# Patient Record
Sex: Male | Born: 1975 | Race: White | Hispanic: No | Marital: Married | Smoking: Current every day smoker
Health system: Southern US, Community
[De-identification: ages and names within clinical notes are randomized; demographics above are authoritative.]

## PROBLEM LIST (undated history)

## (undated) DIAGNOSIS — I10 Essential (primary) hypertension: Secondary | ICD-10-CM

## (undated) HISTORY — PX: CIRCUMCISION: SUR203

## (undated) HISTORY — DX: Essential (primary) hypertension: I10

---

## 1998-08-30 ENCOUNTER — Emergency Department (HOSPITAL_COMMUNITY): Admission: EM | Admit: 1998-08-30 | Discharge: 1998-08-30 | Payer: Self-pay | Admitting: Emergency Medicine

## 1998-08-30 ENCOUNTER — Encounter: Payer: Self-pay | Admitting: Emergency Medicine

## 1998-08-30 ENCOUNTER — Ambulatory Visit (HOSPITAL_BASED_OUTPATIENT_CLINIC_OR_DEPARTMENT_OTHER): Admission: RE | Admit: 1998-08-30 | Discharge: 1998-08-30 | Payer: Self-pay | Admitting: Orthopedic Surgery

## 2001-03-03 ENCOUNTER — Encounter: Payer: Self-pay | Admitting: Emergency Medicine

## 2001-03-03 ENCOUNTER — Emergency Department (HOSPITAL_COMMUNITY): Admission: EM | Admit: 2001-03-03 | Discharge: 2001-03-03 | Payer: Self-pay | Admitting: Emergency Medicine

## 2003-06-21 ENCOUNTER — Emergency Department (HOSPITAL_COMMUNITY): Admission: EM | Admit: 2003-06-21 | Discharge: 2003-06-21 | Payer: Self-pay | Admitting: Emergency Medicine

## 2005-03-17 ENCOUNTER — Emergency Department (HOSPITAL_COMMUNITY): Admission: EM | Admit: 2005-03-17 | Discharge: 2005-03-17 | Payer: Self-pay | Admitting: Emergency Medicine

## 2005-03-24 ENCOUNTER — Emergency Department (HOSPITAL_COMMUNITY): Admission: EM | Admit: 2005-03-24 | Discharge: 2005-03-24 | Payer: Self-pay | Admitting: Emergency Medicine

## 2010-04-05 ENCOUNTER — Emergency Department (HOSPITAL_COMMUNITY): Payer: Self-pay

## 2010-04-05 ENCOUNTER — Emergency Department (HOSPITAL_COMMUNITY)
Admission: EM | Admit: 2010-04-05 | Discharge: 2010-04-05 | Disposition: A | Discharge status: Home / Self Care | Payer: Self-pay | Attending: Emergency Medicine | Admitting: Emergency Medicine

## 2010-04-05 DIAGNOSIS — M25539 Pain in unspecified wrist: Secondary | ICD-10-CM | POA: Insufficient documentation

## 2010-04-05 DIAGNOSIS — M25439 Effusion, unspecified wrist: Secondary | ICD-10-CM | POA: Insufficient documentation

## 2019-12-01 ENCOUNTER — Emergency Department (HOSPITAL_COMMUNITY): Payer: Self-pay

## 2019-12-01 ENCOUNTER — Other Ambulatory Visit: Payer: Self-pay

## 2019-12-01 ENCOUNTER — Emergency Department (HOSPITAL_COMMUNITY)
Admission: EM | Admit: 2019-12-01 | Discharge: 2019-12-02 | Disposition: A | Discharge status: Home / Self Care | Payer: Self-pay | Attending: Emergency Medicine | Admitting: Emergency Medicine

## 2019-12-01 ENCOUNTER — Encounter (HOSPITAL_COMMUNITY): Payer: Self-pay | Admitting: Emergency Medicine

## 2019-12-01 DIAGNOSIS — I160 Hypertensive urgency: Secondary | ICD-10-CM | POA: Insufficient documentation

## 2019-12-01 DIAGNOSIS — F17210 Nicotine dependence, cigarettes, uncomplicated: Secondary | ICD-10-CM | POA: Insufficient documentation

## 2019-12-01 LAB — CBC WITH DIFFERENTIAL/PLATELET
Abs Immature Granulocytes: 0.04 10*3/uL (ref 0.00–0.07)
Basophils Absolute: 0.1 10*3/uL (ref 0.0–0.1)
Basophils Relative: 0 %
Eosinophils Absolute: 0.1 10*3/uL (ref 0.0–0.5)
Eosinophils Relative: 1 %
HCT: 46.5 % (ref 39.0–52.0)
Hemoglobin: 15.8 g/dL (ref 13.0–17.0)
Immature Granulocytes: 0 %
Lymphocytes Relative: 23 %
Lymphs Abs: 3 10*3/uL (ref 0.7–4.0)
MCH: 32.8 pg (ref 26.0–34.0)
MCHC: 34 g/dL (ref 30.0–36.0)
MCV: 96.7 fL (ref 80.0–100.0)
Monocytes Absolute: 0.9 10*3/uL (ref 0.1–1.0)
Monocytes Relative: 7 %
Neutro Abs: 9 10*3/uL — ABNORMAL HIGH (ref 1.7–7.7)
Neutrophils Relative %: 69 %
Platelets: 241 10*3/uL (ref 150–400)
RBC: 4.81 MIL/uL (ref 4.22–5.81)
RDW: 11.9 % (ref 11.5–15.5)
WBC: 13 10*3/uL — ABNORMAL HIGH (ref 4.0–10.5)
nRBC: 0 % (ref 0.0–0.2)

## 2019-12-01 LAB — COMPREHENSIVE METABOLIC PANEL
ALT: 12 U/L (ref 0–44)
AST: 15 U/L (ref 15–41)
Albumin: 4.4 g/dL (ref 3.5–5.0)
Alkaline Phosphatase: 77 U/L (ref 38–126)
Anion gap: 8 (ref 5–15)
BUN: 16 mg/dL (ref 6–20)
CO2: 25 mmol/L (ref 22–32)
Calcium: 9.3 mg/dL (ref 8.9–10.3)
Chloride: 102 mmol/L (ref 98–111)
Creatinine, Ser: 0.99 mg/dL (ref 0.61–1.24)
GFR, Estimated: >60 mL/min (ref >60)
Glucose, Bld: 103 mg/dL — ABNORMAL HIGH (ref 70–99)
Potassium: 3.8 mmol/L (ref 3.5–5.1)
Sodium: 135 mmol/L (ref 135–145)
Total Bilirubin: 0.5 mg/dL (ref 0.3–1.2)
Total Protein: 7.8 g/dL (ref 6.5–8.1)

## 2019-12-01 LAB — ETHANOL: Alcohol, Ethyl (B): <10 mg/dL (ref <10)

## 2019-12-01 LAB — TSH: TSH: 1.227 u[IU]/mL (ref 0.350–4.500)

## 2019-12-01 NOTE — ED Provider Notes (Signed)
[facility] EMERGENCY DEPARTMENT Provider Note   CSN: [id number] Arrival date & time: 12/01/19  1857     History Chief Complaint  Patient presents with  . Hypertension    Brandon Webb is a 44 y.o. male.  HPI    Previously well adult male with history of smoking cigarettes presents with concern for blurry vision, left foot numbness.  He notes no medication usage, smoking cigarettes daily.  He was well until about 1 month ago.  Now over the past month he has noticed episodes of blurry vision bilaterally, loss of sensation on the dorsum of his left foot, fatigue, particular with exertion, but no chest pain, no syncope, no fever, no overt vision loss. As symptoms progressed, he was resistant to evaluation, but today, at the behest of his family member he presents for evaluation. Currently he denies pain, discomfort, complains of ongoing blurry vision, fatigue, and left foot numbness as his primary complaints. History reviewed. No pertinent past medical history.  There are no problems to display for this patient.   History reviewed. No pertinent surgical history.     No family history on file.  Social History   Tobacco Use  . Smoking status: Current Every Day Smoker    Packs/day: 1.00    Years: 20.00    Pack years: 20.00    Types: Cigarettes  . Smokeless tobacco: Never Used  Vaping Use  . Vaping Use: Never used  Substance Use Topics  . Alcohol use: Not Currently  . Drug use: Not Currently    Home Medications Prior to Admission medications   Medication Sig Start Date End Date Taking? Authorizing Provider  ibuprofen (ADVIL) 200 MG tablet Take 200 mg by mouth every 6 (six) hours as needed.   Yes [provider]  tadalafil (CIALIS) 5 MG tablet  08/19/19  Yes [provider]    Allergies    Patient has no known allergies.  Review of Systems   Review of Systems  Constitutional:       Per HPI, otherwise negative  HENT:       Per HPI,  otherwise negative  Respiratory:       Per HPI, otherwise negative  Cardiovascular:       Per HPI, otherwise negative  Gastrointestinal: Negative for vomiting.  Endocrine:       Negative aside from HPI  Genitourinary:       Neg aside from HPI   Musculoskeletal:       Per HPI, otherwise negative  Skin: Negative.   Neurological: Positive for light-headedness and numbness. Negative for tremors, seizures, syncope, facial asymmetry, speech difficulty, weakness and headaches.    Physical Exam Updated Vital Signs BP (!) 136/107   Pulse (!) 58   Temp 98.4 F (36.9 C) (Oral)   Resp 16   Ht 5\' 7"  (1.702 m)   Wt 59 kg   SpO2 99%   BMI 20.36 kg/m   Physical Exam Vitals and nursing note reviewed.  Constitutional:      General: He is not in acute distress.    Appearance: He is well-developed.  HENT:     Head: Normocephalic and atraumatic.  Eyes:     Conjunctiva/sclera: Conjunctivae normal.  Cardiovascular:     Rate and Rhythm: Normal rate and regular rhythm.  Pulmonary:     Effort: Pulmonary effort is normal. No respiratory distress.     Breath sounds: No stridor.  Abdominal:     General: There is no distension.  Skin:    General: Skin is warm and dry.  Neurological:     Mental Status: He is alert and oriented to person, place, and time.     Motor: No weakness, tremor, atrophy, abnormal muscle tone or pronator drift.     Coordination: Coordination normal.     ED Results / Procedures / Treatments   Labs (all labs ordered are listed, but only abnormal results are displayed) Labs Reviewed  COMPREHENSIVE METABOLIC PANEL - Abnormal; Notable for the following components:      Result Value   Glucose, Bld 103 (*)    All other components within normal limits  CBC WITH DIFFERENTIAL/PLATELET - Abnormal; Notable for the following components:   WBC 13.0 (*)    Neutro Abs 9.0 (*)    All other components within normal limits  ETHANOL  TSH  URINALYSIS, ROUTINE W REFLEX  MICROSCOPIC  T4, FREE    EKG EKG Interpretation  Date/Time:  Monday December 01 2019 21:08:19 EST Ventricular Rate:  67 PR Interval:    QRS Duration: 95 QT Interval:  431 QTC Calculation: 455 R Axis:   93 Text Interpretation: Sinus rhythm Probable left atrial enlargement Borderline right axis deviation ST-t wave abnormality hypertrophic changes Abnormal ECG Confirmed by Gerhard Munch [phone]) on 12/01/2019 9:12:45 PM   Radiology CT Head Wo Contrast  Result Date: 12/01/2019 CLINICAL DATA:  Mental status change EXAM: CT HEAD WITHOUT CONTRAST TECHNIQUE: Contiguous axial images were obtained from the base of the skull through the vertex without intravenous contrast. COMPARISON:  None. FINDINGS: Brain: No evidence of acute territorial infarction, hemorrhage, hydrocephalus,extra-axial collection or mass lesion/mass effect. Normal gray-white differentiation. Ventricles are normal in size and contour. Vascular: No hyperdense vessel or unexpected calcification. Skull: The skull is intact. No fracture or focal lesion identified. Sinuses/Orbits: The visualized paranasal sinuses and mastoid air cells are clear. The orbits and globes intact. Other: None IMPRESSION: No acute intracranial abnormality. Electronically Signed   By: Jonna Clark M.D.   On: 12/01/2019 21:38    Procedures Procedures (including critical care time)  Medications Ordered in ED Medications - No data to display  ED Course  I have reviewed the triage vital signs and the nursing notes.  Pertinent labs & imaging results that were available during my care of the patient were reviewed by me and considered in my medical decision making (see chart for details).    MDM Rules/Calculators/A&P                          11:42 PM On repeat exam the patient is now accompanied by his wife.  We discussed all findings, including generally reassuring CT scan, EKG, labs.  No evidence for intracranial pathology, thyroid studies reassuring  as well. Urinalysis somewhat reassuring.  This adult male presents with 1 month of change from baseline with new blurry vision, numbness in his left foot, no objective neuro deficiencies. Given the patient's hypertension here, some suspicion for this contributing to his episodes, though less obvious intracranial phenomena also considered.  Patient, his wife and I also discussed the importance of smoking cessation.  Patient started on antihypertensives will follow up with primary care and neurology as an outpatient for appropriate ongoing studies. Final Clinical Impression(s) / ED Diagnoses Final diagnoses:  Hypertensive urgency    Rx / DC Orders ED Discharge Orders         Ordered    hydrochlorothiazide (HYDRODIURIL) 25 MG tablet  Daily  12/02/19 0015           Gerhard Munch, MD 12/02/19 [phone]

## 2019-12-01 NOTE — ED Triage Notes (Signed)
Pt c/o high blood pressure with blurry vision intermittently x 1 month, pt also reports a "knot" to his left calf x a few months, reports he has numbness to his left foot x 1 week

## 2019-12-02 LAB — URINALYSIS, ROUTINE W REFLEX MICROSCOPIC
Bilirubin Urine: NEGATIVE
Glucose, UA: NEGATIVE mg/dL
Hgb urine dipstick: NEGATIVE
Ketones, ur: NEGATIVE mg/dL
Leukocytes,Ua: NEGATIVE
Nitrite: NEGATIVE
Protein, ur: NEGATIVE mg/dL
Specific Gravity, Urine: 1.025 (ref 1.005–1.030)
pH: 5 (ref 5.0–8.0)

## 2019-12-02 LAB — T4, FREE: Free T4: 0.98 ng/dL (ref 0.61–1.12)

## 2019-12-02 MED ORDER — HYDROCHLOROTHIAZIDE 25 MG PO TABS: 25.0000 mg | ORAL_TABLET | Freq: Every day | ORAL | 2 refills | Status: DC

## 2019-12-02 NOTE — Discharge Instructions (Addendum)
As discussed, your evaluation today has been largely reassuring.  But, it is important that you monitor your condition carefully, and do not hesitate to return to the ED if you develop new, or concerning changes in your condition.  Please follow-up with our neurology physician for appropriate ongoing care.

## 2021-01-11 ENCOUNTER — Ambulatory Visit (INDEPENDENT_AMBULATORY_CARE_PROVIDER_SITE_OTHER): Payer: Self-pay | Admitting: Urology

## 2021-01-11 ENCOUNTER — Other Ambulatory Visit: Payer: Self-pay | Admitting: Urology

## 2021-01-11 ENCOUNTER — Other Ambulatory Visit: Payer: Self-pay

## 2021-01-11 ENCOUNTER — Encounter: Payer: Self-pay | Admitting: Urology

## 2021-01-11 VITALS — BP 144/90 | HR 74 | Ht 67.0 in | Wt 135.0 lb

## 2021-01-11 DIAGNOSIS — N4889 Other specified disorders of penis: Secondary | ICD-10-CM

## 2021-01-11 DIAGNOSIS — N475 Adhesions of prepuce and glans penis: Secondary | ICD-10-CM

## 2021-01-11 NOTE — Progress Notes (Signed)
Surgical Physician Order Form [facility] [place]  * Scheduling expectation : Next Available  *Length of Case: 60 minutes  *MD Preforming Case: Di Kindle, MD  *Assistant Needed: no  *[facility]ility Preference: [facility]  *Clearance needed: no  *Anticoagulation Instructions: N/A  *Aspirin Instructions: N/A  -Admit type: OUTpatient  -Anesthesia: Choice  -Use Standing Orders:  N/A  *Diagnosis:  Penile adhesions with skin bridge  *Procedure: Lysis of penile adhesions  Additional orders:  none  -Equipment:  N/A -VTE Prophylaxis Standing Order SCDs       Other:   -Standing Lab Orders Per Anesthesia    Lab other: None  -Standing Test orders EKG/Chest x-ray per Anesthesia       Test other:   - Medications:   None  -Other orders:  N/A  *Post-op visit Date/Instructions:  1 month follow up

## 2021-01-11 NOTE — Progress Notes (Signed)
Urological Symptom Review  Patient is experiencing the following symptoms:  NO SYMPTOMS  Review of Systems  Gastrointestinal (upper)  : Negative for upper GI symptoms  Gastrointestinal (lower) : Negative for lower GI symptoms  Constitutional : Negative for symptoms  Skin: Negative for skin symptoms  Eyes: Negative for eye symptoms  Ear/Nose/Throat : Negative for Ear/Nose/Throat symptoms  Hematologic/Lymphatic: Swollen glands  Cardiovascular : Negative for cardiovascular symptoms  Respiratory : Negative for respiratory symptoms  Endocrine: Negative for endocrine symptoms  Musculoskeletal: Negative for musculoskeletal symptoms  Neurological: Negative for neurological symptoms  Psychologic: Negative for psychiatric symptoms

## 2021-01-11 NOTE — Progress Notes (Signed)
Assessment: 1. Penile adhesions w/skin bridging      Plan: Options for management discussed with the patient including continued observation versus surgical management with lysis of penile adhesions.  Given the length of the adhesion, I think this would best be done under anesthesia in the operating room.  Risk and benefits of the procedure discussed.  He would like to get information on the cost of the procedure before scheduling. Schedule for lysis of penile adhesions  Procedure: The patient will be scheduled for lysis of penile adhesions with skin bridge at [facility].  Surgical request is placed with the surgery schedulers and will be scheduled at the patient's/family request. Informed consent is given as documented below. Anesthesia:  choice  The patient does not have sleep apnea, history of MRSA, history of VRE, history of cardiac device requiring special anesthetic needs. Patient is stable and considered clear for surgical in an outpatient ambulatory surgery setting as well as patient hospital setting.  Consent for Operation or Procedure: Provider Certification I hereby certify that the nature, purpose, benefits, usual and most frequent risks of, and alternatives to, the operation or procedure have been explained to the patient (or person authorized to sign for the patient) either by me as responsible physician or by the provider who is to perform the operation or procedure. Time spent such that the patient/family has had an opportunity to ask questions, and that those questions have been answered. The patient or the patient's representative has been advised that selected tasks may be performed by assistants to the primary health care provider(s). I believe that the patient (or person authorized to sign for the patient) understands what has been explained, and has consented to the operation or procedure. No guarantees were implied or made.   Chief Complaint:  Chief Complaint   Patient presents with   penile skin bridge    History of Present Illness:  Brandon Webb is a 45 y.o. year old male who is seen for evaluation of penile skin bridge.  He underwent a circumcision as an infant.  Since that time, he has noted a bridge of skin on the dorsal aspect of the penis.  He does note some occasional discharge from this area.  He also has some discomfort with erections.  No history of balanitis.  He is not having any difficulty voiding.  Past Medical History:  Past Medical History:  Diagnosis Date   Hypertension     Past Surgical History:  Past Surgical History:  Procedure Laterality Date   CIRCUMCISION      Allergies:  No Known Allergies  Family History:  History reviewed. No pertinent family history.  Social History:  Social History   Tobacco Use   Smoking status: Every Day    Packs/day: 1.00    Years: 20.00    Pack years: 20.00    Types: Cigarettes   Smokeless tobacco: Never  Vaping Use   Vaping Use: Never used  Substance Use Topics   Alcohol use: Not Currently   Drug use: Not Currently    Review of symptoms:  Constitutional:  Negative for unexplained weight loss, night sweats, fever, chills ENT:  Negative for nose bleeds, sinus pain, painful swallowing CV:  Negative for chest pain, shortness of breath, exercise intolerance, palpitations, loss of consciousness Resp:  Negative for cough, wheezing, shortness of breath GI:  Negative for nausea, vomiting, diarrhea, bloody stools GU:  Positives noted in HPI; otherwise negative for gross hematuria, dysuria, urinary incontinence Neuro:  Negative for seizures, poor balance, limb weakness, slurred speech Psych:  Negative for lack of energy, depression, anxiety Endocrine:  Negative for polydipsia, polyuria, symptoms of hypoglycemia (dizziness, hunger, sweating) Hematologic:  Negative for anemia, purpura, petechia, prolonged or excessive bleeding, use of anticoagulants  Allergic:  Negative  for difficulty breathing or choking as a result of exposure to anything; no shellfish allergy; no allergic response (rash/itch) to materials, foods  Physical exam: BP (!) 144/90    Pulse 74    Ht 5\' 7"  (1.702 m)    Wt 135 lb (61.2 kg)    BMI 21.14 kg/m  GENERAL APPEARANCE:  Well appearing, well developed, well nourished, NAD HEENT: Atraumatic, Normocephalic, oropharynx clear. NECK: Supple without lymphadenopathy or thyromegaly. LUNGS: Clear to auscultation bilaterally. HEART: Regular Rate and Rhythm without murmurs, gallops, or rubs. ABDOMEN: Soft, non-tender, No Masses. EXTREMITIES: Moves all extremities well.  Without clubbing, cyanosis, or edema. NEUROLOGIC:  Alert and oriented x 3, normal gait, CN II-XII grossly intact.  MENTAL STATUS:  Appropriate. BACK:  Non-tender to palpation.  No CVAT SKIN:  Warm, dry and intact.   GU: Penis: Approximately 2 cm skin bridge on dorsal aspect of penis between shaft and glans; no evidence of infection Meatus: Normal Scrotum: normal, no masses   Results: None

## 2021-01-12 ENCOUNTER — Telehealth: Payer: Self-pay | Admitting: Urology

## 2021-01-12 NOTE — Telephone Encounter (Signed)
Called pt. To discuss surgery date of 01/28/21. No answer and Voice mailbox full so I could not leave a message. Sent pt. A mychart message to return my call

## 2021-01-12 NOTE — Progress Notes (Signed)
[facility] Surgery Posting Form   Surgery Date/Time: Date: 01/28/2021  Surgeon: Dr. 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

## 2021-01-24 NOTE — Patient Instructions (Signed)
Brandon Webb  01/24/2021     @PREFPERIOPPHARMACY @   Your procedure is scheduled on  01/28/2021.   Report to 01/30/2021 at  [phone]  A.M.   Call this number if you have problems the morning of surgery:  [phone]   Remember:  Do not eat or drink after midnight.      Take these medicines the morning of surgery with A SIP OF WATER                                         None     Do not wear jewelry, make-up or nail polish.  Do not wear lotions, powders, or perfumes, or deodorant.  Do not shave 48 hours prior to surgery.  Men may shave face and neck.  Do not bring valuables to the hospital.  [facility] is not responsible for any belongings or valuables.  Contacts, dentures or bridgework may not be worn into surgery.  Leave your suitcase in the car.  After surgery it may be brought to your room.  For patients admitted to the hospital, discharge time will be determined by your treatment team.  Patients discharged the day of surgery will not be allowed to drive home and must have someone with them for 24 hours.    Special instructions:   DO NOT smoke tobacco or vape for 24 hours before your procedure.  Please read over the following fact sheets that you were given. Coughing and Deep Breathing, Surgical Site Infection Prevention, Anesthesia Post-op Instructions, and Care and Recovery After Surgery      Incision Care, Adult An incision is a cut that a doctor makes in your skin for surgery. Most times, these cuts are closed after surgery. Your cut from surgery may be closed with: Stitches (sutures). Staples. Skin glue. Skin tape (adhesive) strips. You may need to go back to your doctor to have stitches or staples taken out. This may happen many days or many weeks after your surgery. You need to take good care of your cut so it does not get infected. Follow instructions from your doctor about how to care for your cut. Supplies needed: Soap and  water. A clean hand towel. Wound cleanser. A clean bandage (dressing), if needed. Cream or ointment, if told by your doctor. Clean gauze. How to care for your cut from surgery Cleaning your cut Ask your doctor how to clean your cut. You may need to: Wear medical gloves. Use mild soap and water, or a wound cleanser. Use a clean gauze to pat your cut dry after you clean it. Changing your bandage Wash your hands with soap and water for at least 20 seconds before and after you change your bandage. If you cannot use soap and water, use hand sanitizer. Do not usedisinfectants or antiseptics, such as rubbing alcohol, to clean your wound unless told by your doctor. Change your bandage as told by your doctor. Leavestitches or skin glue in place for at least 2 weeks. Leave tape strips alone unless you are told to take them off. You may trim the edges of the tape strips if they curl up. Put a cream or ointment on your cut. Do this only as told. Cover your cut with a clean bandage. Ask your doctor when you can leave your cut uncovered. Checking for infection  Check your cut area every day for signs of infection. Check for: More redness, swelling, or pain. More fluid or blood. New warmth. Hardness or a new rash around the incision. Pus or a bad smell.  Follow these instructions at home Medicines Take over-the-counter and prescription medicines only as told by your doctor. If you were prescribed an antibiotic medicine, cream, or ointment, use it as told by your doctor. Do not stop using the antibiotic even if you start to feel better. Eating and drinking Eat foods that have a lot of certain nutrients, such as protein, vitamin A, and vitamin C. These foods help your cut heal. Foods rich in protein include meat, fish, eggs, dairy, beans, nuts, and protein drinks. Foods rich in vitamin A include carrots and dark green, leafy vegetables. Foods rich in vitamin C include citrus fruits, tomatoes,  broccoli, and peppers. Drink enough fluid to keep your pee (urine) pale yellow. General instructions  Do not take baths, swim, or use a hot tub. Ask your doctor about taking showers or sponge baths. Limit movement around your cut. This helps with healing. Try not to strain, lift, or exercise for the first 2 weeks, or for as long as told by your doctor. Return to your normal activities as told by your doctor. Ask your doctor what activities are safe for you. Do not scratch, scrub, or pick at your cut. Keep it covered as told by your doctor. Protect your cut from the sun when you are outside for the first 6 months, or for as long as told by your doctor. Cover up the scar area or put on sunscreen that has an SPF of at least 30. Do not use any products that contain nicotine or tobacco, such as cigarettes, e-cigarettes, and chewing tobacco. These can delay cut healing. If you need help quitting, ask your doctor. Keep all follow-up visits. Contact a doctor if: You have any of these signs of infection around your cut: More redness, swelling, or pain. More fluid or blood. New warmth or hardness. Pus or a bad smell. A new rash. You have a fever. You feel like you may vomit (nauseous). You vomit. You are dizzy. Your stitches, staples, skin glue, or tape strips come undone. Your cut gets bigger. You have a fever. Get help right away if: Your cut bleeds through your bandage, and bleeding does not stop with gentle pressure. Your cut opens up and comes apart. These symptoms may be an emergency. Do not wait to see if the symptoms will go away. Get medical help right away. Call your local emergency services (911 in the [place] Do not drive yourself to the hospital. Summary Follow instructions from your doctor about how to care for your cut. Wash your hands with soap and water for at least 20 seconds before and after you change your bandage. If you cannot use soap and water, use hand sanitizer. Check  your cut area every day for signs of infection. Keep all follow-up visits. This information is not intended to replace advice given to you by your health care provider. Make sure you discuss any questions you have with your health care provider. Document Revised: 04/05/2020 Document Reviewed: 04/05/2020 Elsevier Patient Education  2022 [employer] General Anesthesia, Adult, Care After This sheet gives you information about how to care for yourself after your procedure. Your health care provider may also give you more specific instructions. If you have problems or questions, contact your health care provider. What can I expect  after the procedure? After the procedure, the following side effects are common: Pain or discomfort at the IV site. Nausea. Vomiting. Sore throat. Trouble concentrating. Feeling cold or chills. Feeling weak or tired. Sleepiness and fatigue. Soreness and body aches. These side effects can affect parts of the body that were not involved in surgery. Follow these instructions at home: For the time period you were told by your health care provider:  Rest. Do not participate in activities where you could fall or become injured. Do not drive or use machinery. Do not drink alcohol. Do not take sleeping pills or medicines that cause drowsiness. Do not make important decisions or sign legal documents. Do not take care of children on your own. Eating and drinking Follow any instructions from your health care provider about eating or drinking restrictions. When you feel hungry, start by eating small amounts of foods that are soft and easy to digest (bland), such as toast. Gradually return to your regular diet. Drink enough fluid to keep your urine pale yellow. If you vomit, rehydrate by drinking water, juice, or clear broth. General instructions If you have sleep apnea, surgery and certain medicines can increase your risk for breathing problems. Follow instructions from  your health care provider about wearing your sleep device: Anytime you are sleeping, including during daytime naps. While taking prescription pain medicines, sleeping medicines, or medicines that make you drowsy. Have a responsible adult stay with you for the time you are told. It is important to have someone help care for you until you are awake and alert. Return to your normal activities as told by your health care provider. Ask your health care provider what activities are safe for you. Take over-the-counter and prescription medicines only as told by your health care provider. If you smoke, do not smoke without supervision. Keep all follow-up visits as told by your health care provider. This is important. Contact a health care provider if: You have nausea or vomiting that does not get better with medicine. You cannot eat or drink without vomiting. You have pain that does not get better with medicine. You are unable to pass urine. You develop a skin rash. You have a fever. You have redness around your IV site that gets worse. Get help right away if: You have difficulty breathing. You have chest pain. You have blood in your urine or stool, or you vomit blood. Summary After the procedure, it is common to have a sore throat or nausea. It is also common to feel tired. Have a responsible adult stay with you for the time you are told. It is important to have someone help care for you until you are awake and alert. When you feel hungry, start by eating small amounts of foods that are soft and easy to digest (bland), such as toast. Gradually return to your regular diet. Drink enough fluid to keep your urine pale yellow. Return to your normal activities as told by your health care provider. Ask your health care provider what activities are safe for you. This information is not intended to replace advice given to you by your health care provider. Make sure you discuss any questions you have with  your health care provider. Document Revised: 09/18/2019 Document Reviewed: 04/17/2019 Elsevier Patient Education  2022 [employer] Inc[employer] How to Use Chlorhexidine for Bathing Chlorhexidine gluconate (CHG) is a germ-killing (antiseptic) solution that is used to clean the skin. It can get rid of the bacteria that normally live on the skin and  can keep them away for about 24 hours. To clean your skin with CHG, you may be given: A CHG solution to use in the shower or as part of a sponge bath. A prepackaged cloth that contains CHG. Cleaning your skin with CHG may help lower the risk for infection: While you are staying in the intensive care unit of the hospital. If you have a vascular access, such as a central line, to provide short-term or long-term access to your veins. If you have a catheter to drain urine from your bladder. If you are on a ventilator. A ventilator is a machine that helps you breathe by moving air in and out of your lungs. After surgery. What are the risks? Risks of using CHG include: A skin reaction. Hearing loss, if CHG gets in your ears and you have a perforated eardrum. Eye injury, if CHG gets in your eyes and is not rinsed out. The CHG product catching fire. Make sure that you avoid smoking and flames after applying CHG to your skin. Do not use CHG: If you have a chlorhexidine allergy or have previously reacted to chlorhexidine. On babies younger than 71 months of age. How to use CHG solution Use CHG only as told by your health care provider, and follow the instructions on the label. Use the full amount of CHG as directed. Usually, this is one bottle. During a shower Follow these steps when using CHG solution during a shower (unless your health care provider gives you different instructions): Start the shower. Use your normal soap and shampoo to wash your face and hair. Turn off the shower or move out of the shower stream. Pour the CHG onto a clean washcloth. Do not use  any type of brush or rough-edged sponge. Starting at your neck, lather your body down to your toes. Make sure you follow these instructions: If you will be having surgery, pay special attention to the part of your body where you will be having surgery. Scrub this area for at least 1 minute. Do not use CHG on your head or face. If the solution gets into your ears or eyes, rinse them well with water. Avoid your genital area. Avoid any areas of skin that have broken skin, cuts, or scrapes. Scrub your back and under your arms. Make sure to wash skin folds. Let the lather sit on your skin for 1-2 minutes or as long as told by your health care provider. Thoroughly rinse your entire body in the shower. Make sure that all body creases and crevices are rinsed well. Dry off with a clean towel. Do not put any substances on your body afterward--such as powder, lotion, or perfume--unless you are told to do so by your health care provider. Only use lotions that are recommended by the manufacturer. Put on clean clothes or pajamas. If it is the night before your surgery, sleep in clean sheets.  During a sponge bath Follow these steps when using CHG solution during a sponge bath (unless your health care provider gives you different instructions): Use your normal soap and shampoo to wash your face and hair. Pour the CHG onto a clean washcloth. Starting at your neck, lather your body down to your toes. Make sure you follow these instructions: If you will be having surgery, pay special attention to the part of your body where you will be having surgery. Scrub this area for at least 1 minute. Do not use CHG on your head or face. If the solution  gets into your ears or eyes, rinse them well with water. Avoid your genital area. Avoid any areas of skin that have broken skin, cuts, or scrapes. Scrub your back and under your arms. Make sure to wash skin folds. Let the lather sit on your skin for 1-2 minutes or as long  as told by your health care provider. Using a different clean, wet washcloth, thoroughly rinse your entire body. Make sure that all body creases and crevices are rinsed well. Dry off with a clean towel. Do not put any substances on your body afterward--such as powder, lotion, or perfume--unless you are told to do so by your health care provider. Only use lotions that are recommended by the manufacturer. Put on clean clothes or pajamas. If it is the night before your surgery, sleep in clean sheets. How to use CHG prepackaged cloths Only use CHG cloths as told by your health care provider, and follow the instructions on the label. Use the CHG cloth on clean, dry skin. Do not use the CHG cloth on your head or face unless your health care provider tells you to. When washing with the CHG cloth: Avoid your genital area. Avoid any areas of skin that have broken skin, cuts, or scrapes. Before surgery Follow these steps when using a CHG cloth to clean before surgery (unless your health care provider gives you different instructions): Using the CHG cloth, vigorously scrub the part of your body where you will be having surgery. Scrub using a back-and-forth motion for 3 minutes. The area on your body should be completely wet with CHG when you are done scrubbing. Do not rinse. Discard the cloth and let the area air-dry. Do not put any substances on the area afterward, such as powder, lotion, or perfume. Put on clean clothes or pajamas. If it is the night before your surgery, sleep in clean sheets.  For general bathing Follow these steps when using CHG cloths for general bathing (unless your health care provider gives you different instructions). Use a separate CHG cloth for each area of your body. Make sure you wash between any folds of skin and between your fingers and toes. Wash your body in the following order, switching to a new cloth after each step: The front of your neck, shoulders, and chest. Both  of your arms, under your arms, and your hands. Your stomach and groin area, avoiding the genitals. Your right leg and foot. Your left leg and foot. The back of your neck, your back, and your buttocks. Do not rinse. Discard the cloth and let the area air-dry. Do not put any substances on your body afterward--such as powder, lotion, or perfume--unless you are told to do so by your health care provider. Only use lotions that are recommended by the manufacturer. Put on clean clothes or pajamas. Contact a health care provider if: Your skin gets irritated after scrubbing. You have questions about using your solution or cloth. You swallow any chlorhexidine. Call your local poison control center (567 [phone] in the [place] Get help right away if: Your eyes itch badly, or they become very red or swollen. Your skin itches badly and is red or swollen. Your hearing changes. You have trouble seeing. You have swelling or tingling in your mouth or throat. You have trouble breathing. These symptoms may represent a serious problem that is an emergency. Do not wait to see if the symptoms will go away. Get medical help right away. Call your local emergency services (911 in the  [place] Do not drive yourself to the hospital. Summary Chlorhexidine gluconate (CHG) is a germ-killing (antiseptic) solution that is used to clean the skin. Cleaning your skin with CHG may help to lower your risk for infection. You may be given CHG to use for bathing. It may be in a bottle or in a prepackaged cloth to use on your skin. Carefully follow your health care provider's instructions and the instructions on the product label. Do not use CHG if you have a chlorhexidine allergy. Contact your health care provider if your skin gets irritated after scrubbing. This information is not intended to replace advice given to you by your health care provider. Make sure you discuss any questions you have with your health care  provider. Document Revised: 03/15/2020 Document Reviewed: 03/15/2020 Elsevier Patient Education  2022 [employer].

## 2021-01-26 ENCOUNTER — Other Ambulatory Visit (HOSPITAL_COMMUNITY): Payer: Self-pay

## 2021-01-26 ENCOUNTER — Encounter (HOSPITAL_COMMUNITY)
Admission: RE | Admit: 2021-01-26 | Discharge: 2021-01-26 | Disposition: A | Discharge status: Home / Self Care | Payer: Self-pay | Source: Ambulatory Visit | Attending: Urology | Admitting: Urology

## 2021-01-26 DIAGNOSIS — Z01810 Encounter for preprocedural cardiovascular examination: Secondary | ICD-10-CM | POA: Insufficient documentation

## 2021-01-27 ENCOUNTER — Encounter (HOSPITAL_COMMUNITY): Payer: Self-pay | Admitting: Certified Registered"

## 2021-01-28 ENCOUNTER — Encounter (HOSPITAL_COMMUNITY): Admission: RE | Payer: Self-pay | Source: Home / Self Care

## 2021-01-28 ENCOUNTER — Encounter (HOSPITAL_COMMUNITY): Payer: Self-pay | Admitting: Urology

## 2021-01-28 ENCOUNTER — Ambulatory Visit (HOSPITAL_COMMUNITY): Admission: RE | Admit: 2021-01-28 | Payer: Self-pay | Source: Home / Self Care | Admitting: Urology

## 2021-01-28 SURGERY — CIRCUMCISION, ADULT
Anesthesia: General

## 2021-01-28 MED ORDER — FENTANYL CITRATE (PF) 250 MCG/5ML IJ SOLN
INTRAMUSCULAR | Status: AC
  Filled 2021-01-28: qty 5

## 2021-01-28 MED ORDER — PROPOFOL 10 MG/ML IV BOLUS
INTRAVENOUS | Status: AC
  Filled 2021-01-28: qty 20

## 2021-01-28 MED ORDER — LIDOCAINE HCL (PF) 2 % IJ SOLN
INTRAMUSCULAR | Status: AC
  Filled 2021-01-28: qty 5

## 2021-01-28 MED ORDER — DEXAMETHASONE SODIUM PHOSPHATE 10 MG/ML IJ SOLN
INTRAMUSCULAR | Status: AC
  Filled 2021-01-28: qty 1

## 2021-01-28 MED ORDER — GLYCOPYRROLATE PF 0.2 MG/ML IJ SOSY
PREFILLED_SYRINGE | INTRAMUSCULAR | Status: AC
  Filled 2021-01-28: qty 1

## 2021-01-28 MED ORDER — BUPIVACAINE HCL (PF) 0.25 % IJ SOLN
INTRAMUSCULAR | Status: AC
  Filled 2021-01-28: qty 30

## 2021-01-28 MED ORDER — MIDAZOLAM HCL 2 MG/2ML IJ SOLN
INTRAMUSCULAR | Status: AC
  Filled 2021-01-28: qty 2

## 2021-01-28 MED ORDER — ONDANSETRON HCL 4 MG/2ML IJ SOLN
INTRAMUSCULAR | Status: AC
  Filled 2021-01-28: qty 2

## 2021-01-28 NOTE — Progress Notes (Signed)
Pt no show for procedure. Called pt home, talked with wife. Stated pt was sick with respiratory issues and would not be coming for procedure. Instructed her to call office and reschedule. Voiced understanding.

## 2021-03-01 ENCOUNTER — Ambulatory Visit: Payer: Self-pay | Admitting: Urology

## 2021-03-01 NOTE — Progress Notes (Unsigned)
Assessment: 1. Penile adhesions w/skin bridging       Plan: Options for management discussed with the patient including continued observation versus surgical management with lysis of penile adhesions.  Given the length of the adhesion, I think this would best be done under anesthesia in the operating room.  Risk and benefits of the procedure discussed.  He would like to get information on the cost of the procedure before scheduling. Schedule for lysis of penile adhesions  Procedure: The patient will be scheduled for lysis of penile adhesions with skin bridge at [facility].  Surgical request is placed with the surgery schedulers and will be scheduled at the patient's/family request. Informed consent is given as documented below. Anesthesia:  choice  The patient does not have sleep apnea, history of MRSA, history of VRE, history of cardiac device requiring special anesthetic needs. Patient is stable and considered clear for surgical in an outpatient ambulatory surgery setting as well as patient hospital setting.  Consent for Operation or Procedure: Provider Certification I hereby certify that the nature, purpose, benefits, usual and most frequent risks of, and alternatives to, the operation or procedure have been explained to the patient (or person authorized to sign for the patient) either by me as responsible physician or by the provider who is to perform the operation or procedure. Time spent such that the patient/family has had an opportunity to ask questions, and that those questions have been answered. The patient or the patient's representative has been advised that selected tasks may be performed by assistants to the primary health care provider(s). I believe that the patient (or person authorized to sign for the patient) understands what has been explained, and has consented to the operation or procedure. No guarantees were implied or made.   Chief Complaint:  No chief complaint on  file.   History of Present Illness:  Brandon Webb is a 46 y.o. year old male who is seen for evaluation of penile skin bridge.  He underwent a circumcision as an infant.  Since that time, he has noted a bridge of skin on the dorsal aspect of the penis.  He does note some occasional discharge from this area.  He also has some discomfort with erections.  No history of balanitis.  He is not having any difficulty voiding.  Past Medical History:  Past Medical History:  Diagnosis Date   Hypertension     Past Surgical History:  Past Surgical History:  Procedure Laterality Date   CIRCUMCISION      Allergies:  No Known Allergies  Family History:  No family history on file.  Social History:  Social History   Tobacco Use   Smoking status: Every Day    Packs/day: 1.00    Years: 20.00    Pack years: 20.00    Types: Cigarettes   Smokeless tobacco: Never  Vaping Use   Vaping Use: Never used  Substance Use Topics   Alcohol use: Not Currently   Drug use: Not Currently    Review of symptoms:  Constitutional:  Negative for unexplained weight loss, night sweats, fever, chills ENT:  Negative for nose bleeds, sinus pain, painful swallowing CV:  Negative for chest pain, shortness of breath, exercise intolerance, palpitations, loss of consciousness Resp:  Negative for cough, wheezing, shortness of breath GI:  Negative for nausea, vomiting, diarrhea, bloody stools GU:  Positives noted in HPI; otherwise negative for gross hematuria, dysuria, urinary incontinence Neuro:  Negative for seizures, poor balance, limb weakness,  slurred speech Psych:  Negative for lack of energy, depression, anxiety Endocrine:  Negative for polydipsia, polyuria, symptoms of hypoglycemia (dizziness, hunger, sweating) Hematologic:  Negative for anemia, purpura, petechia, prolonged or excessive bleeding, use of anticoagulants  Allergic:  Negative for difficulty breathing or choking as a result of exposure to  anything; no shellfish allergy; no allergic response (rash/itch) to materials, foods  Physical exam: There were no vitals taken for this visit. GENERAL APPEARANCE:  Well appearing, well developed, well nourished, NAD HEENT: Atraumatic, Normocephalic, oropharynx clear. NECK: Supple without lymphadenopathy or thyromegaly. LUNGS: Clear to auscultation bilaterally. HEART: Regular Rate and Rhythm without murmurs, gallops, or rubs. ABDOMEN: Soft, non-tender, No Masses. EXTREMITIES: Moves all extremities well.  Without clubbing, cyanosis, or edema. NEUROLOGIC:  Alert and oriented x 3, normal gait, CN II-XII grossly intact.  MENTAL STATUS:  Appropriate. BACK:  Non-tender to palpation.  No CVAT SKIN:  Warm, dry and intact.   GU: Penis: Approximately 2 cm skin bridge on dorsal aspect of penis between shaft and glans; no evidence of infection Meatus: Normal Scrotum: normal, no masses   Results: None

## 2021-10-16 IMAGING — CT CT HEAD W/O CM
3 series · 15 of 46 positions shown, 18 images · non-contrast
Comparison: None.

CLINICAL DATA: Mental status change

EXAM:
CT HEAD WITHOUT CONTRAST
TECHNIQUE: Contiguous axial images were obtained from the base of the skull
through the vertex without intravenous contrast.

[Series 2: head w o · axial · 0.43mm/px · z∈[+249,+369]mm · 9 of 29 slices shown, 12 images]
[im 3/29  brain]
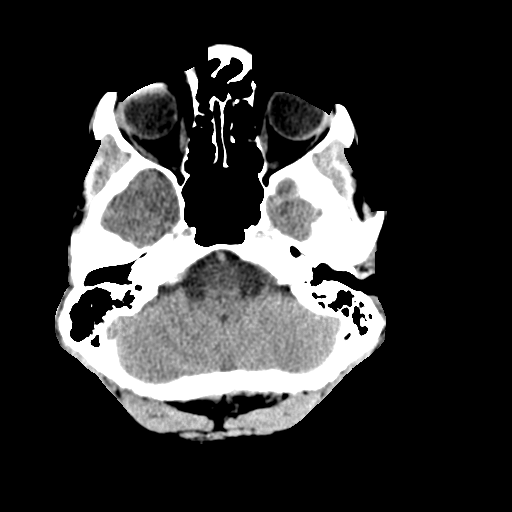
[im 3/29  bone]
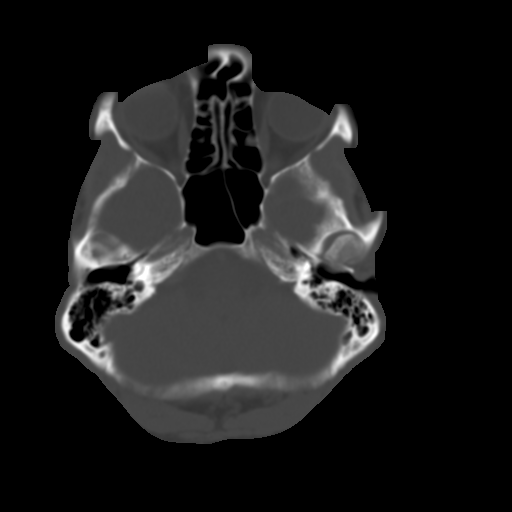
[im 6/29  brain]
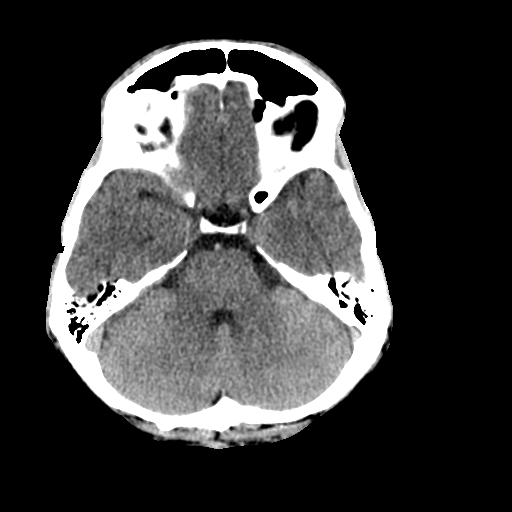
[im 9/29  brain]
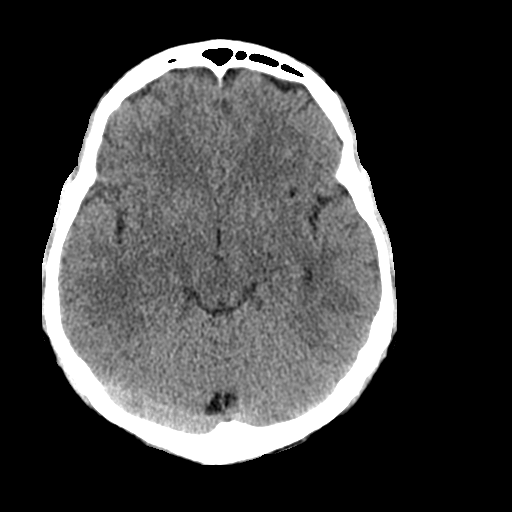
[im 12/29  brain]
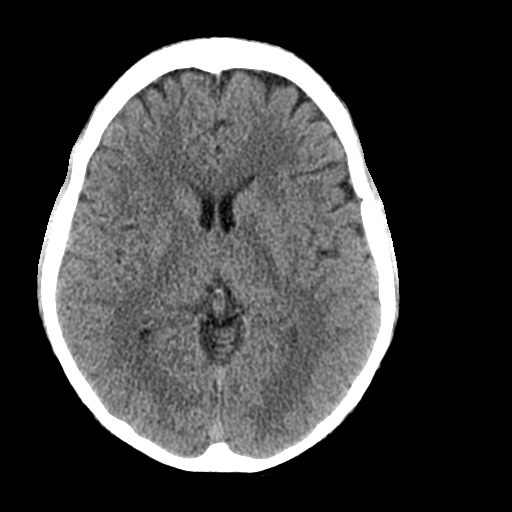
[im 15/29  brain]
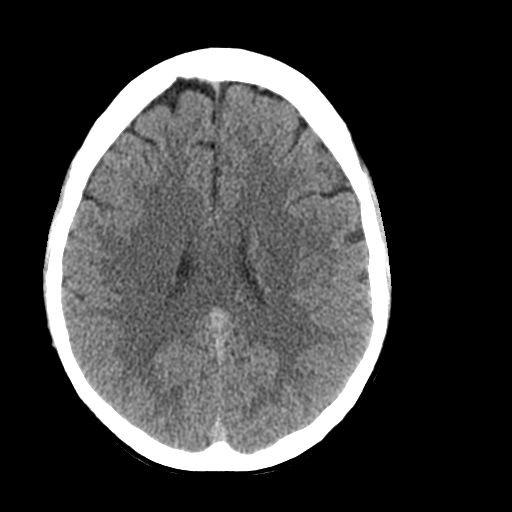
[im 15/29  bone]
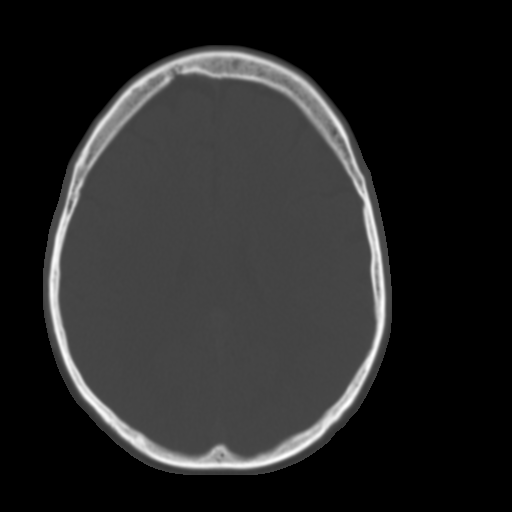
[im 18/29  brain]
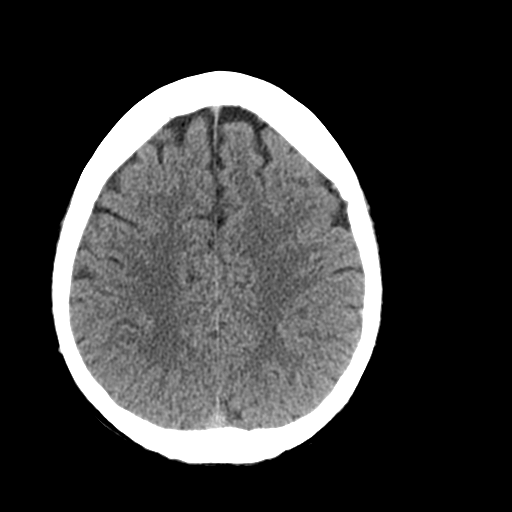
[im 21/29  brain]
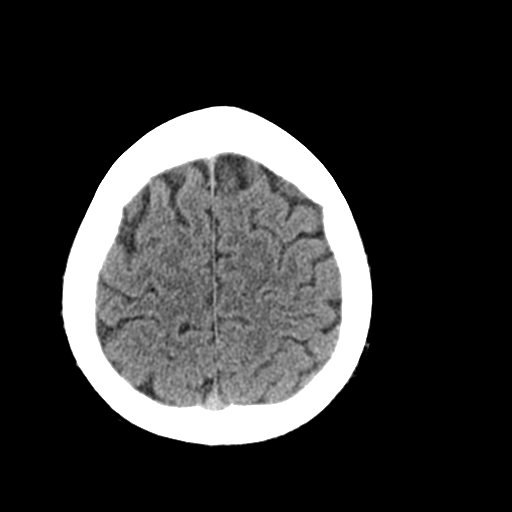
[im 24/29  brain]
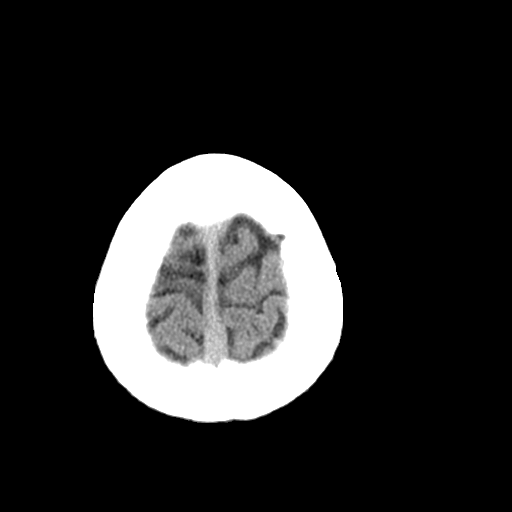
[im 27/29  brain]
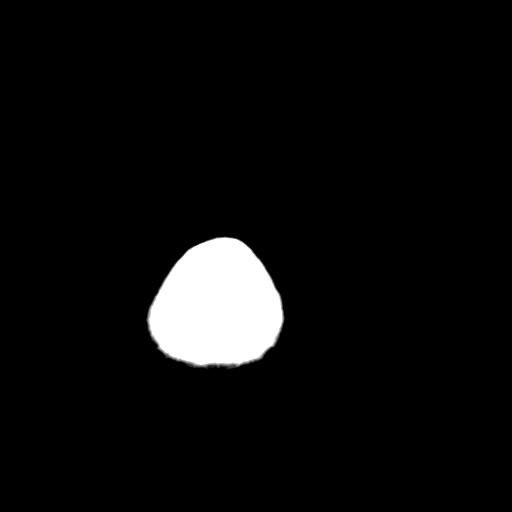
[im 27/29  bone]
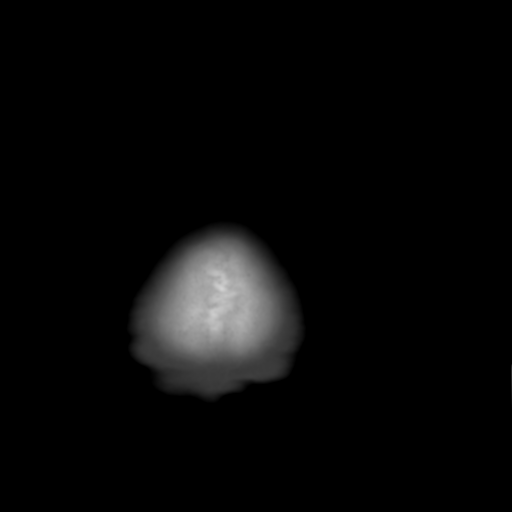

[Series 4: coronal soft · coronal · 0.29mm/px · 3 of 66 slices shown]
[im 22/66  brain]
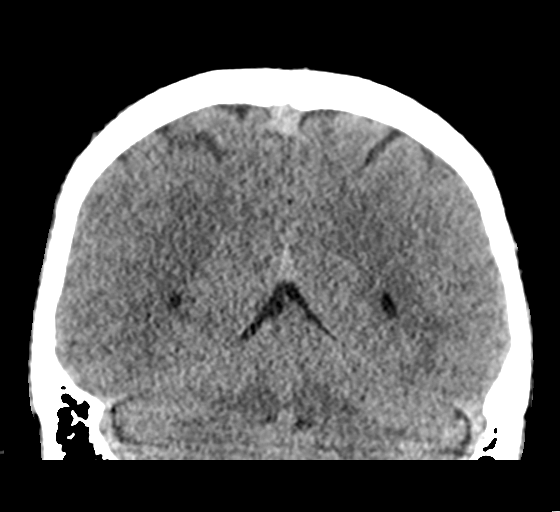
[im 29/66  brain]
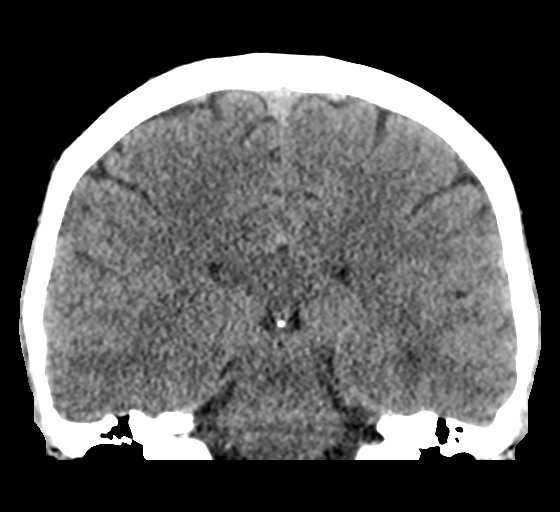
[im 37/66  brain]
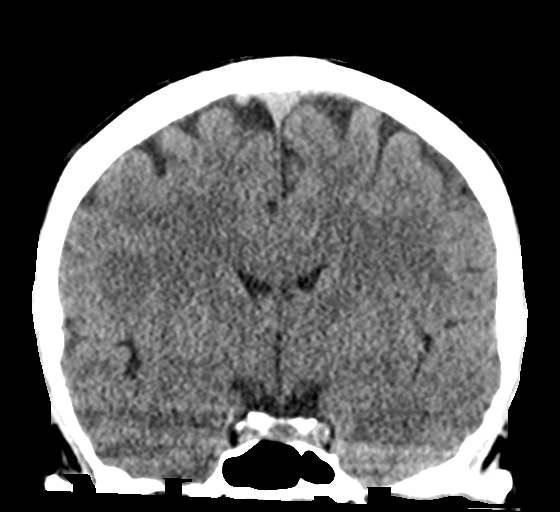

[Series 5: sagittal soft · sagittal · 0.27mm/px · 3 of 55 slices shown]
[im 19/55  brain]
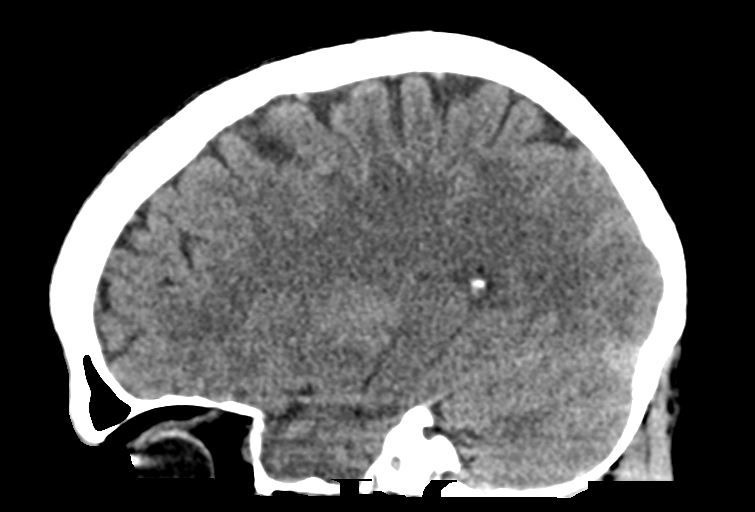
[im 28/55  brain]
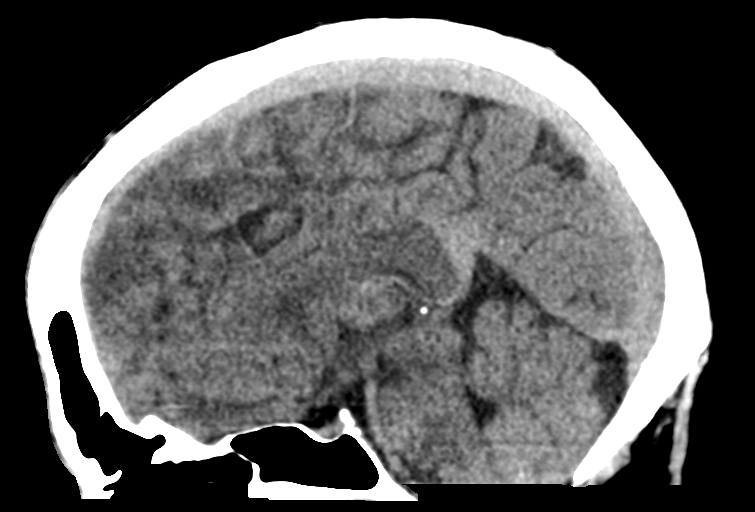
[im 37/55  brain]
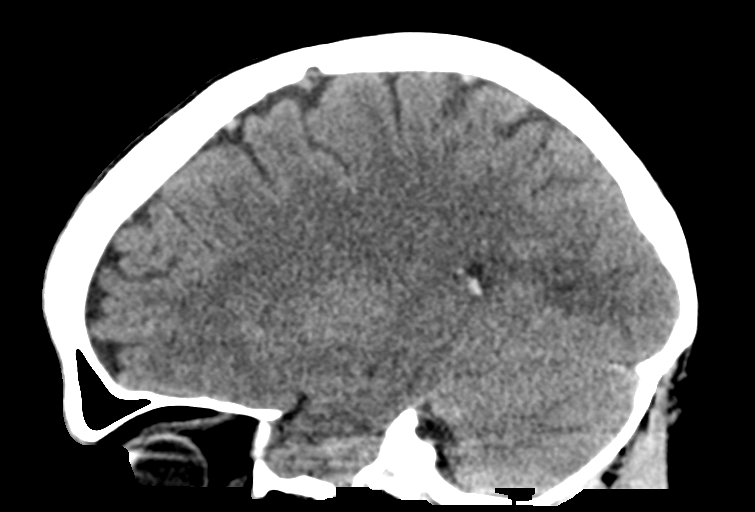

[15 of 46 positions shown; findings below may reference images not displayed]

FINDINGS: Brain: No evidence of acute territorial infarction, hemorrhage,
hydrocephalus,extra-axial collection or mass lesion/mass effect.
Normal gray-white differentiation. Ventricles are normal in size and
contour.

Vascular: No hyperdense vessel or unexpected calcification.

Skull: The skull is intact. No fracture or focal lesion identified.

Sinuses/Orbits: The visualized paranasal sinuses and mastoid air
cells are clear. The orbits and globes intact.

Other: None
IMPRESSION: No acute intracranial abnormality.

## 2022-03-17 DIAGNOSIS — Z419 Encounter for procedure for purposes other than remedying health state, unspecified: Secondary | ICD-10-CM | POA: Diagnosis not present

## 2022-04-17 DIAGNOSIS — Z419 Encounter for procedure for purposes other than remedying health state, unspecified: Secondary | ICD-10-CM | POA: Diagnosis not present

## 2022-04-27 ENCOUNTER — Emergency Department (HOSPITAL_COMMUNITY)
Admission: EM | Admit: 2022-04-27 | Discharge: 2022-04-27 | Disposition: A | Discharge status: Home / Self Care | Payer: Medicaid Other | Attending: Emergency Medicine | Admitting: Emergency Medicine

## 2022-04-27 ENCOUNTER — Other Ambulatory Visit: Payer: Self-pay

## 2022-04-27 ENCOUNTER — Emergency Department (HOSPITAL_COMMUNITY): Payer: Medicaid Other

## 2022-04-27 ENCOUNTER — Encounter (HOSPITAL_COMMUNITY): Payer: Self-pay | Admitting: Emergency Medicine

## 2022-04-27 DIAGNOSIS — M7989 Other specified soft tissue disorders: Secondary | ICD-10-CM | POA: Diagnosis not present

## 2022-04-27 DIAGNOSIS — X503XXA Overexertion from repetitive movements, initial encounter: Secondary | ICD-10-CM | POA: Diagnosis not present

## 2022-04-27 DIAGNOSIS — M79632 Pain in left forearm: Secondary | ICD-10-CM | POA: Diagnosis not present

## 2022-04-27 MED ORDER — IBUPROFEN 800 MG PO TABS
800.0000 mg | ORAL_TABLET | Freq: Once | ORAL | Status: AC
  Administered 2022-04-27: 800 mg via ORAL
  Filled 2022-04-27: qty 1

## 2022-04-27 NOTE — ED Provider Notes (Signed)
EMERGENCY DEPARTMENT AT [facility] Provider Note   CSN: [id number] Arrival date & time: 04/27/22  1939     History Chief Complaint  Patient presents with   Arm Pain    BASILE CDEBACA is a 47 y.o. male patient who presents to the emergency department today for further evaluation of left forearm pain that has been ongoing for 1 week.  Patient endorses some pain and swelling over the area.  He states he works at [employer] and does a lot of repetitive movement throughout the day.  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  He states that is worse after work and better with rest.  He has been taking ibuprofen intermittently for pain.  He denies any injury or trauma to the forearm.  He denies any alcohol or drug use.   Arm Pain       Home Medications Prior to Admission medications   Medication Sig Start Date End Date Taking? Authorizing Provider  ibuprofen (ADVIL) 200 MG tablet Take 200 mg by mouth every 6 (six) hours as needed.    [provider]  tadalafil (CIALIS) 5 MG tablet Take 5 mg by mouth daily as needed for erectile dysfunction. 08/19/19   [provider]      Allergies    Patient has no known allergies.    Review of Systems   Review of Systems  All other systems reviewed and are negative.   Physical Exam Updated Vital Signs BP (!) 157/115 (BP Location: Right Arm)   Pulse 86   Temp 98.2 F (36.8 C) (Oral)   Resp 15   Ht 5\' 7"  (1.702 m)   Wt 59 kg   SpO2 100%   BMI 20.36 kg/m  Physical Exam Vitals and nursing note reviewed.  Constitutional:      Appearance: Normal appearance.  HENT:     Head: Normocephalic and atraumatic.  Eyes:     General:        Right eye: No discharge.        Left eye: No discharge.     Conjunctiva/sclera: Conjunctivae normal.  Pulmonary:     Effort: Pulmonary effort is normal.   Musculoskeletal:     Comments: There is some mild tenderness over the flexor musculature of the left forearm.  Range of motion is somewhat limited with extension of the wrist.  Good sensation and good cap refill.  2+ radial pulse felt on the left.  Skin:    General: Skin is warm and dry.     Findings: No rash.  Neurological:     General: No focal deficit present.     Mental Status: He is alert.  Psychiatric:        Mood and Affect: Mood normal.        Behavior: Behavior normal.     ED Results / Procedures / Treatments   Labs (all labs ordered are listed, but only abnormal results are displayed) Labs Reviewed - No data to display  EKG None  Radiology DG Forearm Left  Result Date: 04/27/2022 CLINICAL DATA:  Pain and swelling. EXAM: LEFT FOREARM - 2 VIEW COMPARISON:  None Available. FINDINGS: There is no evidence of fracture or other focal bone lesions. Soft tissues are unremarkable. IMPRESSION: Negative. Electronically Signed   By: Aram Candela M.D.   On: 04/27/2022 21:04  Procedures Procedures    Medications Ordered in ED Medications  ibuprofen (ADVIL) tablet 800 mg (has no administration in time range)    ED Course/ Medical Decision Making/ A&P Clinical Course as of 04/27/22 2239  Thu Apr 27, 2022  2226 DG Forearm Left I personally ordered and interpreted the study and do not see any evidence of fracture or dislocation.  I do agree with the radiologist interpretation. [CF]    Clinical Course User Index [CF] Teressa Lower, PA-C   {   Click here for ABCD2, HEART and other calculators  Medical Decision Making MARLAN LENNEY is a 47 y.o. male patient who presents to the emergency department today for further evaluation of left forearm pain.  I suspect this is likely from an overuse injury.  Imaging was normal as highlighted in the ED course.  I will place the patient on conservative therapy including anti-inflammatories, ice regiment, and heat regiment.   Patient does not have a primary care doctor so I will instruct him to come back to the emergency room if symptoms do not improve.  Offered patient Toradol which he declined.  He is safe for discharge.   Amount and/or Complexity of Data Reviewed Radiology: ordered. Decision-making details documented in ED Course.   Final Clinical Impression(s) / ED Diagnoses Final diagnoses:  Left forearm pain    Rx / DC Orders ED Discharge Orders     None         Jolyn Lent 04/27/22 2239    Rondel Baton, MD 04/28/22 [phone]

## 2022-04-27 NOTE — ED Triage Notes (Signed)
Pt c/o pain and swelling to left forearm for the past week. Pt denies any known injuries to area.

## 2022-04-27 NOTE — Discharge Instructions (Signed)
As we discussed, I would like for you to take 600 mg of ibuprofen every 6 hours and you can Stach on Tylenol at the 3 to 4-hour mark.  When you are not working out ice down the forearm and before you go to work try some heat to see if that helps.  You may return to the emergency department for any worsening symptoms.

## 2022-05-17 DIAGNOSIS — Z419 Encounter for procedure for purposes other than remedying health state, unspecified: Secondary | ICD-10-CM | POA: Diagnosis not present

## 2022-06-17 DIAGNOSIS — Z419 Encounter for procedure for purposes other than remedying health state, unspecified: Secondary | ICD-10-CM | POA: Diagnosis not present

## 2022-07-17 DIAGNOSIS — Z419 Encounter for procedure for purposes other than remedying health state, unspecified: Secondary | ICD-10-CM | POA: Diagnosis not present

## 2022-08-17 DIAGNOSIS — Z419 Encounter for procedure for purposes other than remedying health state, unspecified: Secondary | ICD-10-CM | POA: Diagnosis not present

## 2022-09-17 DIAGNOSIS — Z419 Encounter for procedure for purposes other than remedying health state, unspecified: Secondary | ICD-10-CM | POA: Diagnosis not present

## 2022-10-17 DIAGNOSIS — Z419 Encounter for procedure for purposes other than remedying health state, unspecified: Secondary | ICD-10-CM | POA: Diagnosis not present

## 2022-11-17 DIAGNOSIS — Z419 Encounter for procedure for purposes other than remedying health state, unspecified: Secondary | ICD-10-CM | POA: Diagnosis not present

## 2022-12-17 DIAGNOSIS — Z419 Encounter for procedure for purposes other than remedying health state, unspecified: Secondary | ICD-10-CM | POA: Diagnosis not present

## 2023-01-17 DIAGNOSIS — Z419 Encounter for procedure for purposes other than remedying health state, unspecified: Secondary | ICD-10-CM | POA: Diagnosis not present

## 2023-02-17 DIAGNOSIS — Z419 Encounter for procedure for purposes other than remedying health state, unspecified: Secondary | ICD-10-CM | POA: Diagnosis not present

## 2023-03-17 DIAGNOSIS — Z419 Encounter for procedure for purposes other than remedying health state, unspecified: Secondary | ICD-10-CM | POA: Diagnosis not present

## 2023-04-28 DIAGNOSIS — Z419 Encounter for procedure for purposes other than remedying health state, unspecified: Secondary | ICD-10-CM | POA: Diagnosis not present

## 2023-05-28 DIAGNOSIS — Z419 Encounter for procedure for purposes other than remedying health state, unspecified: Secondary | ICD-10-CM | POA: Diagnosis not present

## 2023-06-28 DIAGNOSIS — Z419 Encounter for procedure for purposes other than remedying health state, unspecified: Secondary | ICD-10-CM | POA: Diagnosis not present

## 2023-07-28 DIAGNOSIS — Z419 Encounter for procedure for purposes other than remedying health state, unspecified: Secondary | ICD-10-CM | POA: Diagnosis not present

## 2023-08-28 DIAGNOSIS — Z419 Encounter for procedure for purposes other than remedying health state, unspecified: Secondary | ICD-10-CM | POA: Diagnosis not present

## 2023-09-28 DIAGNOSIS — Z419 Encounter for procedure for purposes other than remedying health state, unspecified: Secondary | ICD-10-CM | POA: Diagnosis not present

## 2023-10-28 DIAGNOSIS — Z419 Encounter for procedure for purposes other than remedying health state, unspecified: Secondary | ICD-10-CM | POA: Diagnosis not present

## 2023-11-28 DIAGNOSIS — Z419 Encounter for procedure for purposes other than remedying health state, unspecified: Secondary | ICD-10-CM | POA: Diagnosis not present
